# Patient Record
Sex: Male | Born: 1985 | Race: White | Hispanic: Yes | Marital: Married | Smoking: Never smoker
Health system: Southern US, Community
[De-identification: ages and names within clinical notes are randomized; demographics above are authoritative.]

## PROBLEM LIST (undated history)

## (undated) HISTORY — PX: SPINE SURGERY: SHX786

## (undated) HISTORY — PX: SPINAL FUSION W/ LUQUE UNIT ROD: SHX2426

---

## 2012-01-30 ENCOUNTER — Ambulatory Visit (INDEPENDENT_AMBULATORY_CARE_PROVIDER_SITE_OTHER): Payer: BC Managed Care – PPO | Admitting: Emergency Medicine

## 2012-01-30 VITALS — BP 96/63 | HR 91 | Temp 99.0°F | Resp 18 | Ht 66.5 in | Wt 176.0 lb

## 2012-01-30 DIAGNOSIS — R509 Fever, unspecified: Secondary | ICD-10-CM

## 2012-01-30 DIAGNOSIS — J029 Acute pharyngitis, unspecified: Secondary | ICD-10-CM

## 2012-01-30 DIAGNOSIS — A088 Other specified intestinal infections: Secondary | ICD-10-CM

## 2012-01-30 LAB — POCT RAPID STREP A (OFFICE): Rapid Strep A Screen: NEGATIVE

## 2012-01-30 MED ORDER — ONDANSETRON 4 MG PO TBDP: 4.0000 mg | ORAL_TABLET | Freq: Four times a day (QID) | ORAL | Status: AC | PRN

## 2012-01-30 MED ORDER — LOPERAMIDE HCL 2 MG PO TABS: 2.0000 mg | ORAL_TABLET | Freq: Four times a day (QID) | ORAL | Status: AC | PRN

## 2012-01-30 NOTE — Patient Instructions (Signed)
Viral Gastroenteritis Viral gastroenteritis is also known as stomach flu. This condition affects the stomach and intestinal tract. It can cause sudden diarrhea and vomiting. The illness typically lasts 3 to 8 days. Most people develop an immune response that eventually gets rid of the virus. While this natural response develops, the virus can make you quite ill. CAUSES  Many different viruses can cause gastroenteritis, such as rotavirus or noroviruses. You can catch one of these viruses by consuming contaminated food or water. You may also catch a virus by sharing utensils or other personal items with an infected person or by touching a contaminated surface. SYMPTOMS  The most common symptoms are diarrhea and vomiting. These problems can cause a severe loss of body fluids (dehydration) and a body salt (electrolyte) imbalance. Other symptoms may include:  Fever.   Headache.   Fatigue.   Abdominal pain.  DIAGNOSIS  Your caregiver can usually diagnose viral gastroenteritis based on your symptoms and a physical exam. A stool sample may also be taken to test for the presence of viruses or other infections. TREATMENT  This illness typically goes away on its own. Treatments are aimed at rehydration. The most serious cases of viral gastroenteritis involve vomiting so severely that you are not able to keep fluids down. In these cases, fluids must be given through an intravenous line (IV). HOME CARE INSTRUCTIONS   Drink enough fluids to keep your urine clear or pale yellow. Drink small amounts of fluids frequently and increase the amounts as tolerated.   Ask your caregiver for specific rehydration instructions.   Avoid:   Foods high in sugar.   Alcohol.   Carbonated drinks.   Tobacco.   Juice.   Caffeine drinks.   Extremely hot or cold fluids.   Fatty, greasy foods.   Too much intake of anything at one time.   Dairy products until 24 to 48 hours after diarrhea stops.   You may  consume probiotics. Probiotics are active cultures of beneficial bacteria. They may lessen the amount and number of diarrheal stools in adults. Probiotics can be found in yogurt with active cultures and in supplements.   Wash your hands well to avoid spreading the virus.   Only take over-the-counter or prescription medicines for pain, discomfort, or fever as directed by your caregiver. Do not give aspirin to children. Antidiarrheal medicines are not recommended.   Ask your caregiver if you should continue to take your regular prescribed and over-the-counter medicines.   Keep all follow-up appointments as directed by your caregiver.  SEEK IMMEDIATE MEDICAL CARE IF:   You are unable to keep fluids down.   You do not urinate at least once every 6 to 8 hours.   You develop shortness of breath.   You notice blood in your stool or vomit. This may look like coffee grounds.   You have abdominal pain that increases or is concentrated in one small area (localized).   You have persistent vomiting or diarrhea.   You have a fever.   The patient is a child younger than 3 months, and he or she has a fever.   The patient is a child older than 3 months, and he or she has a fever and persistent symptoms.   The patient is a child older than 3 months, and he or she has a fever and symptoms suddenly get worse.   The patient is a baby, and he or she has no tears when crying.  MAKE SURE YOU:     Understand these instructions.   Will watch your condition.   Will get help right away if you are not doing well or get worse.  Document Released: 06/07/2005 Document Revised: 05/27/2011 Document Reviewed: 03/24/2011 Exit[employer] Patient Information 2012 [employer]. 

## 2012-01-30 NOTE — Progress Notes (Signed)
   Date:  01/30/2012   Name:  Mario Pierce   DOB:  May 29, 1986   MRN:  [id number] Gender: male  Age: 26 y.o.  PCP:  No primary provider on file.    Chief Complaint: Knee Pain, Chills, Fever, Nausea, Headache and Generalized Body Aches   History of Present Illness:  Mario Pierce is a 26 y.o. pleasant patient who presents with the following:  Traveled to [place] for weekend and returned yesterday.  Has experienced myalgias, arthralgias, headache, nausea no vomiting.  On episode of loose stool yesterday.  No fever but chilled.  Some abdominal discomfort.  Poor po intake today.  No other sick at home or work.  There is no problem list on file for this patient.   No past medical history on file.  No past surgical history on file.  History  Substance Use Topics  . Smoking status: Never Smoker   . Smokeless tobacco: Not on file  . Alcohol Use: No    No family history on file.  No Known Allergies  Medication list has been reviewed and updated.  No current outpatient prescriptions on file prior to visit.    Review of Systems:  As per HPI, otherwise negative.    Physical Examination: Filed Vitals:   01/30/12 1709  BP: 96/63  Pulse: 91  Temp: 99 F (37.2 C)  Resp: 18   Filed Vitals:   01/30/12 1709  Height: 5' 6.5" (1.689 m)  Weight: 176 lb (79.833 kg)   Body mass index is 27.98 kg/(m^2). Ideal Body Weight: Weight in (lb) to have BMI = 25: 156.9    GEN: WDWN, NAD, Non-toxic, Alert & Oriented x 3 HEENT: Atraumatic, Normocephalic. Red throat.   Ears and Nose: No external deformity. EXTR: No clubbing/cyanosis/edema NEURO: Normal gait.  PSYCH: Normally interactive. Conversant. Not depressed or anxious appearing.  Calm demeanor.  Abdomen soft not tender benign Assessment and Plan: Viral gastroenteritis zofran Imodium Fluids Follow up as needed   Carmelina Dane, MD  Results for orders placed in visit on 01/30/12  POCT RAPID STREP A (OFFICE)      Component  Value Range   Rapid Strep A Screen Negative  Negative

## 2012-12-20 ENCOUNTER — Ambulatory Visit (INDEPENDENT_AMBULATORY_CARE_PROVIDER_SITE_OTHER): Payer: BC Managed Care – PPO | Admitting: Family Medicine

## 2012-12-20 ENCOUNTER — Other Ambulatory Visit: Payer: Self-pay

## 2012-12-20 VITALS — BP 110/70 | HR 70 | Temp 98.0°F | Resp 16 | Ht 66.0 in | Wt 187.0 lb

## 2012-12-20 DIAGNOSIS — R058 Other specified cough: Secondary | ICD-10-CM

## 2012-12-20 DIAGNOSIS — R05 Cough: Secondary | ICD-10-CM

## 2012-12-20 DIAGNOSIS — R059 Cough, unspecified: Secondary | ICD-10-CM

## 2012-12-20 MED ORDER — BENZONATATE 100 MG PO CAPS: ORAL_CAPSULE | ORAL | Status: DC

## 2012-12-20 MED ORDER — ALBUTEROL SULFATE (2.5 MG/3ML) 0.083% IN NEBU
2.5000 mg | INHALATION_SOLUTION | Freq: Once | RESPIRATORY_TRACT | Status: AC
  Administered 2012-12-20: 2.5 mg via RESPIRATORY_TRACT

## 2012-12-20 MED ORDER — HYDROCODONE-HOMATROPINE 5-1.5 MG/5ML PO SYRP: 5.0000 mL | ORAL_SOLUTION | ORAL | Status: DC | PRN

## 2012-12-20 MED ORDER — ALBUTEROL SULFATE HFA 108 (90 BASE) MCG/ACT IN AERS: 2.0000 | INHALATION_SPRAY | Freq: Four times a day (QID) | RESPIRATORY_TRACT | Status: AC | PRN

## 2012-12-20 MED ORDER — PREDNISONE 20 MG PO TABS: ORAL_TABLET | ORAL | Status: DC

## 2012-12-20 NOTE — Patient Instructions (Addendum)
Drink plenty of fluids  Get enough rest  Use the inhaler 2 inhalations 4 times daily  Take the prednisone 3 pills daily for 2 days, then 2 daily for 2 days, then one daily for 2 days  Use the cough syrup when you're not going to be on duty or before sleep  Use the cough pills every 6-8 hours as needed during waking hours  Return if worse

## 2012-12-20 NOTE — Progress Notes (Signed)
Subjective: Patient has had a cough and congestion for about 3 weeks. He went elsewhere to be seen by a physician 11 days ago, and was treated with a Z-Pak and some cough pills and Mucinex DM. He has not improved. He continues to persistently cough. He is not wheezing. He is not very productive of sputum. He is not feverish. He started coughing.  Objective: Constantly coughing otherwise in no acute distress. His TMs are normal. Throat is clear. His uvula is a little bit edematous dangling. Neck supple without significant nodes. Chest is clear to auscultation without wheezing rhonchi or rales. Heart regular without murmurs.he flow was 460 with a predicted 600.  Assessment: Post viral cough syndrome  Cough  Plan: Nebulizer  albuterol

## 2015-04-29 ENCOUNTER — Emergency Department (INDEPENDENT_AMBULATORY_CARE_PROVIDER_SITE_OTHER): Payer: BLUE CROSS/BLUE SHIELD

## 2015-04-29 ENCOUNTER — Encounter: Payer: Self-pay | Admitting: *Deleted

## 2015-04-29 ENCOUNTER — Emergency Department
Admission: EM | Admit: 2015-04-29 | Discharge: 2015-04-29 | Disposition: A | Discharge status: Home / Self Care | Payer: BLUE CROSS/BLUE SHIELD | Source: Home / Self Care | Attending: Family Medicine | Admitting: Family Medicine

## 2015-04-29 DIAGNOSIS — R05 Cough: Secondary | ICD-10-CM

## 2015-04-29 DIAGNOSIS — J209 Acute bronchitis, unspecified: Secondary | ICD-10-CM

## 2015-04-29 MED ORDER — AZITHROMYCIN 250 MG PO TABS: ORAL_TABLET | ORAL | Status: DC

## 2015-04-29 MED ORDER — GUAIFENESIN-CODEINE 100-10 MG/5ML PO SOLN: ORAL | Status: DC

## 2015-04-29 MED ORDER — PREDNISONE 20 MG PO TABS: 20.0000 mg | ORAL_TABLET | Freq: Two times a day (BID) | ORAL | Status: DC

## 2015-04-29 NOTE — ED Provider Notes (Signed)
CSN: [id number]     Arrival date & time 04/29/15  1113 History   First MD Initiated Contact with Patient 04/29/15 1144     Chief Complaint  Patient presents with  . Cough      HPI Comments: About 3 weeks ago patient developed typical cold-like symptoms including mild sore throat, sinus congestion, headache, fatigue, and cough.  The nasal congestion and sore throat have improved but he has a persistent cough that is partly productive and worse at night.  He occasionally wheezes, and has had chills/sweats at night.  He often coughs until he gags, especially at night.  He states that he had a Tdap just before his present illness started.  The history is provided by the patient.    History reviewed. No pertinent past medical history. Past Surgical History  Procedure Laterality Date  . Spine surgery     Family History  Problem Relation Age of Onset  . Adopted: Yes   Social History  Substance Use Topics  . Smoking status: Never Smoker   . Smokeless tobacco: None  . Alcohol Use: No    Review of Systems  + sore throat, resolved + cough No pleuritic pain + wheezing + nasal congestion + post-nasal drainage No sinus pain/pressure No itchy/red eyes No earache No hemoptysis + SOB with activity No fever, + chills/sweats No nausea No vomiting No abdominal pain No diarrhea No urinary symptoms No skin rash + fatigue No myalgias + headache Used OTC meds without relief  Allergies  Review of patient's allergies indicates no known allergies.  Home Medications   Prior to Admission medications   Medication Sig Start Date End Date Taking? Authorizing Provider  albuterol (PROVENTIL HFA;VENTOLIN HFA) 108 (90 BASE) MCG/ACT inhaler Inhale 2 puffs into the lungs every 6 (six) hours as needed for wheezing. 12/20/12   Peyton Najjar, MD  azithromycin (ZITHROMAX Z-PAK) 250 MG tablet Take 2 tabs today; then begin one tab once daily for 4 more days. 04/29/15   Lattie Haw, MD   guaiFENesin-codeine 100-10 MG/5ML syrup Take 10mL by mouth at bedtime as needed for cough 04/29/15   Lattie Haw, MD  predniSONE (DELTASONE) 20 MG tablet Take 1 tablet (20 mg total) by mouth 2 (two) times daily. Take with food. 04/29/15   Lattie Haw, MD   Meds Ordered and Administered this Visit  Medications - No data to display  BP 122/80 mmHg  Pulse 115  Temp(Src) 98.5 F (36.9 C) (Oral)  Resp 18  Ht  (1.727 m)  Wt 222 lb (100.699 kg)  BMI 33.76 kg/m2  SpO2 98% No data found.   Physical Exam Nursing notes and Vital Signs reviewed. Appearance:  Patient appears stated age, and in no acute distress Eyes:  Pupils are equal, round, and reactive to light and accomodation.  Extraocular movement is intact.  Conjunctivae are not inflamed  Ears:  Canals normal.  Tympanic membranes normal.  Nose:  Mildly congested turbinates.  No sinus tenderness.   Pharynx:  Normal Neck:  Supple.  Tender enlarged posterior nodes are palpated bilaterally  Lungs:  Clear to auscultation.  Breath sounds are equal.  Moving air well. Heart:  Regular rate and rhythm without murmurs, rubs, or gallops.  Abdomen:  Nontender without masses or hepatosplenomegaly.  Bowel sounds are present.  No CVA or flank tenderness.  Extremities:  No edema.    Skin:  No rash present.   ED Course  Procedures  None   Imaging  Review Dg Chest 2 View  04/29/2015  CLINICAL DATA:  Cough for 2.5 weeks EXAM: CHEST  2 VIEW COMPARISON:  None. FINDINGS: Lungs are clear. Heart size and pulmonary vascularity are normal. No adenopathy. There is evidence of old rib trauma with remodeling involving the right lateral seventh rib. There is postoperative change at multiple levels in the thoracic and visualized lumbar spine. 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 Postoperative change in the spinal region. No edema or consolidation. Electronically Signed   By: Bretta BangWilliam  Woodruff III M.D.   On: 04/29/2015 12:15      MDM   1. Acute  bronchitis, unspecified organism; ?pertussis    Begin Z-pak for atypical coverage.  Rx for Robitussin AC for night time cough.  Begin prednisone burst. Take plain guaifenesin (1200mg  extended release tabs such as Mucinex) twice daily, with plenty of water, for cough and congestion.  If desired, may add Pseudoephedrine (30mg , one or two every 4 to 6 hours) for sinus congestion.  Get adequate rest.   Stop all antihistamines for now, and other non-prescription cough/cold preparations.   Follow-up with family doctor if not improving about 7 to10 days.     Lattie HawStephen A Nakhia Levitan, MD 04/29/15 [phone]

## 2015-04-29 NOTE — Discharge Instructions (Signed)
Take plain guaifenesin (1200mg  extended release tabs such as Mucinex) twice daily, with plenty of water, for cough and congestion.  If desired, may add Pseudoephedrine (30mg , one or two every 4 to 6 hours) for sinus congestion.  Get adequate rest.   Stop all antihistamines for now, and other non-prescription cough/cold preparations.   Follow-up with family doctor if not improving about 7 to10 days.

## 2015-04-29 NOTE — ED Notes (Signed)
Pt c/o URI s/s and nasal congestion x 3 wks, with productive cough now. Denies fever.

## 2015-07-02 DIAGNOSIS — K760 Fatty (change of) liver, not elsewhere classified: Secondary | ICD-10-CM | POA: Insufficient documentation

## 2016-04-13 IMAGING — CR DG CHEST 2V
2 series · 2 of 2 positions shown · non-contrast
Comparison: None.

CLINICAL DATA: Cough for 2.5 weeks

EXAM:
CHEST  2 VIEW

[chest pa]
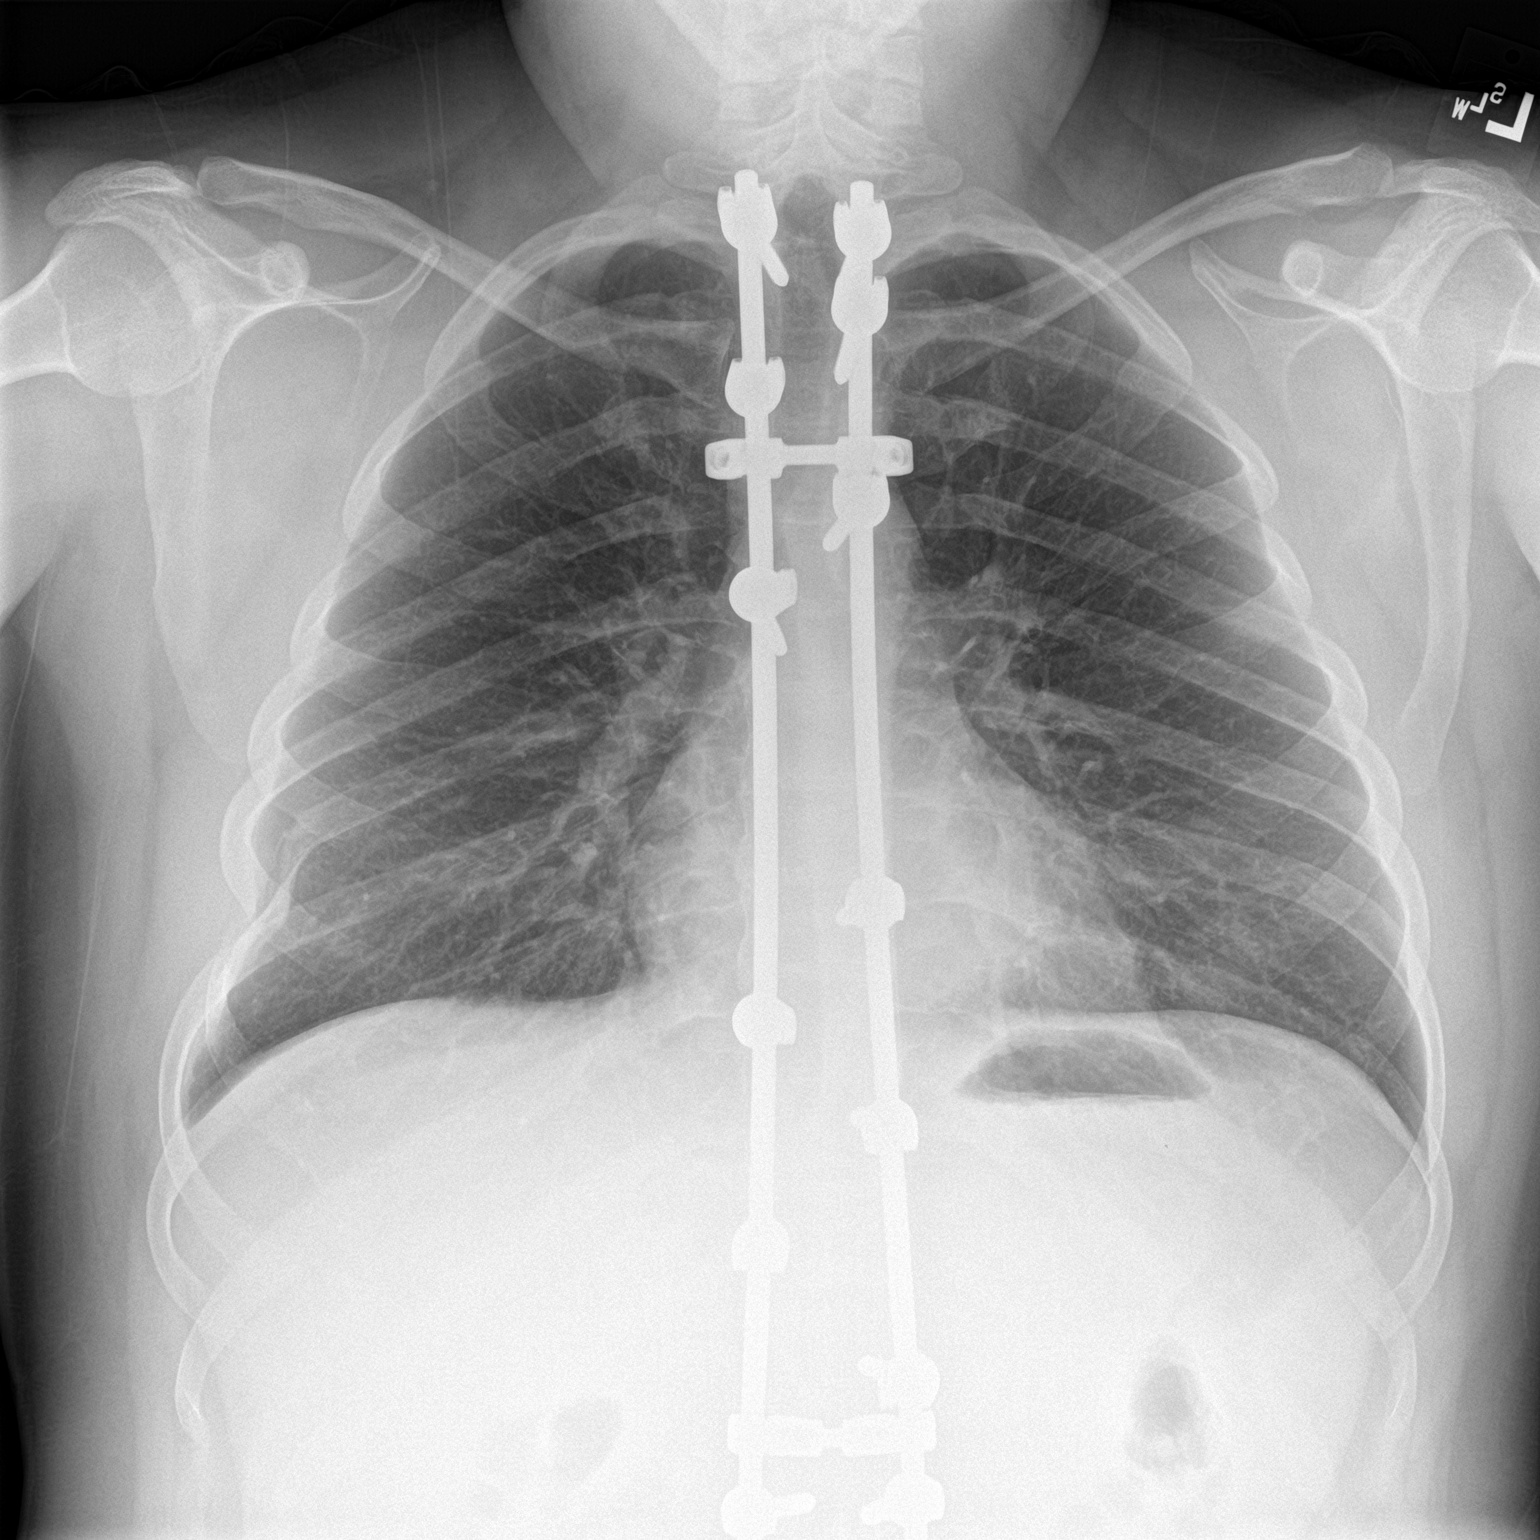

[chest lat]
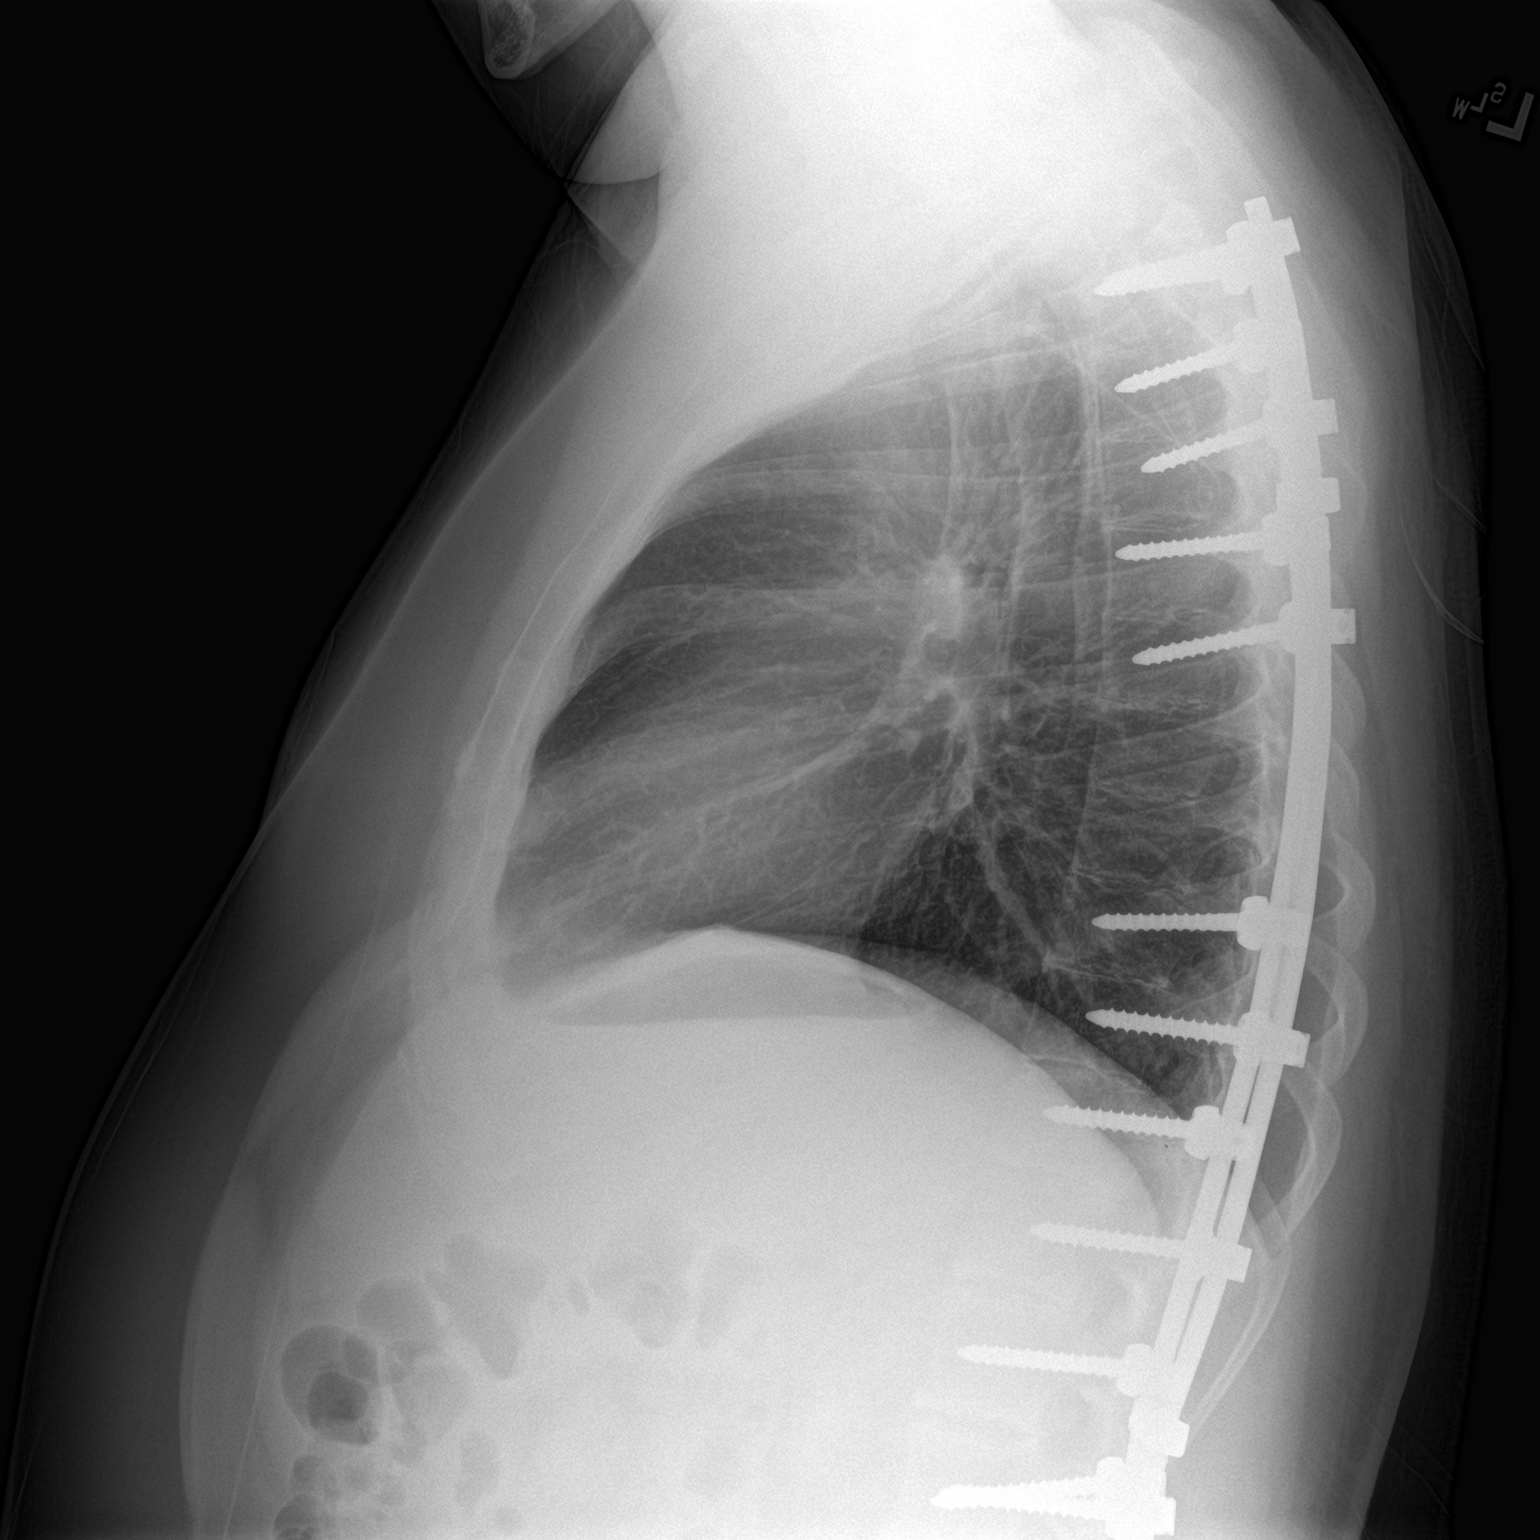

[2 of 2 positions shown; findings below may reference images not displayed]

FINDINGS: Lungs are clear. Heart size and pulmonary vascularity are normal. No
adenopathy. There is evidence of old rib trauma with remodeling
involving the right lateral seventh rib. There is postoperative
change at multiple levels in the thoracic and visualized lumbar
spine.
IMPRESSION: Old rib trauma on the right. Postoperative change in the spinal
region. No edema or consolidation.

## 2020-12-08 ENCOUNTER — Ambulatory Visit (INDEPENDENT_AMBULATORY_CARE_PROVIDER_SITE_OTHER): Payer: Managed Care, Other (non HMO) | Admitting: Family Medicine

## 2020-12-08 ENCOUNTER — Encounter: Payer: Self-pay | Admitting: Family Medicine

## 2020-12-08 VITALS — BP 123/70 | HR 72 | Temp 98.0°F | Ht 67.91 in | Wt 198.6 lb

## 2020-12-08 DIAGNOSIS — R110 Nausea: Secondary | ICD-10-CM | POA: Diagnosis not present

## 2020-12-08 DIAGNOSIS — R140 Abdominal distension (gaseous): Secondary | ICD-10-CM | POA: Diagnosis not present

## 2020-12-08 DIAGNOSIS — F411 Generalized anxiety disorder: Secondary | ICD-10-CM | POA: Diagnosis not present

## 2020-12-08 DIAGNOSIS — K5909 Other constipation: Secondary | ICD-10-CM

## 2020-12-08 MED ORDER — DULOXETINE HCL 30 MG PO CPEP: 30.0000 mg | ORAL_CAPSULE | Freq: Every day | ORAL | 3 refills | Status: DC

## 2020-12-08 NOTE — Patient Instructions (Signed)
Let's try adding cymbalta on.  Referral entered to GI.  See me again in about 1 month.

## 2020-12-08 NOTE — Progress Notes (Signed)
Mario Pierce - 35 y.o. male MRN [id number]  Date of birth: [birth year]  Subjective Chief Complaint  Patient presents with   Establish Care    HPI Mario Pierce is a 35 y.o. male here today for initial visit to establish care.  He has some GI issues for several months.  These include bloating, constipation, cramping and nausea.  He has been seen at [facility] and had endoscopy previously.  He had some labs including celiac panel.  He was started on linzess which has provided some relief of constipation however he continues to have bloating and cramping.    He also admits to increased stress with anxiety.  He had this previously and took cymbalta.  He did very well with this and did not have significant side effects that he recalls.   ROS:  A comprehensive ROS was completed and negative except as noted per HPI  Allergies  Allergen Reactions   Mushroom Extract Complex Rash    History reviewed. No pertinent past medical history.  Past Surgical History:  Procedure Laterality Date   SPINAL FUSION W/ LUQUE UNIT ROD     SPINE SURGERY      Social History   Socioeconomic History   Marital status: Married    Spouse name: Not on file   Number of children: Not on file   Years of education: Not on file   Highest education level: Not on file  Occupational History   Occupation: Emergency planning/management officer  Tobacco Use   Smoking status: Never   Smokeless tobacco: Never  Vaping Use   Vaping Use: Never used  Substance and Sexual Activity   Alcohol use: Yes    Alcohol/week: 1.0 - 2.0 standard drink    Types: 1 - 2 Standard drinks or equivalent per week    Comment: Rarely   Drug use: No   Sexual activity: Yes    Partners: Female  Other Topics Concern   Not on file  Social History Narrative   Not on file   Social Determinants of Health   Financial Resource Strain: Not on file  Food Insecurity: Not on file  Transportation Needs: Not on file  Physical Activity: Not on file  Stress: Not on file  Social  Connections: Not on file    Family History  Adopted: Yes    Health Maintenance  Topic Date Due   HIV Screening  Never done   Hepatitis C Screening  Never done   COVID-19 Vaccine (3 - Booster for Pfizer series) 02/18/2020   INFLUENZA VACCINE  01/19/2021   TETANUS/TDAP  03/26/2025   Pneumococcal Vaccine 58-2 Years old  Aged Out   HPV VACCINES  Aged Out     ----------------------------------------------------------------------------------------------------------------------------------------------------------------------------------------------------------------- Physical Exam BP 123/70 (BP Location: Left Arm, Patient Position: Sitting, Cuff Size: Normal)   Pulse 72   Temp 98 F (36.7 C)   Ht 5' 7.91" (1.725 m)   Wt 198 lb 9.6 oz (90.1 kg)   SpO2 97%   BMI 30.27 kg/m   Physical Exam Constitutional:      Appearance: Normal appearance.  Eyes:     General: No scleral icterus. Cardiovascular:     Rate and Rhythm: Normal rate and regular rhythm.  Pulmonary:     Effort: Pulmonary effort is normal.     Breath sounds: Normal breath sounds.  Neurological:     General: No focal deficit present.     Mental Status: He is alert.  Psychiatric:        Mood  and Affect: Mood normal.    ------------------------------------------------------------------------------------------------------------------------------------------------------------------------------------------------------------------- Assessment and Plan  Chronic constipation Chronic constipation with bloating and cramping.  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  Recommend continuation of linzess.  Requesting referral to [facility], orders entered.  Can try IBgard as well.   GAD (generalized anxiety disorder) Good relief with cymbalta previously.  Will restart at 30mg .  Potential side effects reviewed.   Return in about 4 weeks (around 01/05/2021) for Mood.   Meds ordered this encounter  Medications   DULoxetine (CYMBALTA) 30 MG capsule     Sig: Take 1 capsule (30 mg total) by mouth daily.    Dispense:  30 capsule    Refill:  3    Return in about 4 weeks (around 01/05/2021) for Mood.    This visit occurred during the SARS-CoV-2 public health emergency.  Safety protocols were in place, including screening questions prior to the visit, additional usage of staff PPE, and extensive cleaning of exam room while observing appropriate contact time as indicated for disinfecting solutions.

## 2020-12-08 NOTE — Assessment & Plan Note (Signed)
Good relief with cymbalta previously.  Will restart at 30mg .  Potential side effects reviewed.   Return in about 4 weeks (around 01/05/2021) for Mood.

## 2020-12-08 NOTE — Assessment & Plan Note (Signed)
Chronic constipation with bloating and cramping.  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  Recommend continuation of linzess.  Requesting referral to [facility], orders entered.  Can try IBgard as well.

## 2021-11-23 ENCOUNTER — Telehealth: Payer: Self-pay | Admitting: General Practice

## 2021-11-23 NOTE — Telephone Encounter (Signed)
Transition Care Management Unsuccessful Follow-up Telephone Call  Date of discharge and from where:  11/19/21 from [facility]  Attempts:  1st Attempt  Reason for unsuccessful TCM follow-up call:  Left voice message    

## 2021-11-24 NOTE — Telephone Encounter (Signed)
Transition Care Management Unsuccessful Follow-up Telephone Call  Date of discharge and from where:  11/19/21 from [facility]  Attempts:  2nd Attempt  Reason for unsuccessful TCM follow-up call:  Left voice message

## 2021-11-25 NOTE — Telephone Encounter (Signed)
Transition Care Management Unsuccessful Follow-up Telephone Call  Date of discharge and from where:  11/19/21 from [facility]  Attempts:  3rd Attempt  Reason for unsuccessful TCM follow-up call:  Left voice message    

## 2021-12-28 ENCOUNTER — Encounter: Payer: Self-pay | Admitting: Family Medicine

## 2021-12-28 ENCOUNTER — Telehealth (INDEPENDENT_AMBULATORY_CARE_PROVIDER_SITE_OTHER): Payer: Self-pay | Admitting: Family Medicine

## 2021-12-28 DIAGNOSIS — N521 Erectile dysfunction due to diseases classified elsewhere: Secondary | ICD-10-CM

## 2021-12-28 DIAGNOSIS — F411 Generalized anxiety disorder: Secondary | ICD-10-CM

## 2021-12-28 DIAGNOSIS — N529 Male erectile dysfunction, unspecified: Secondary | ICD-10-CM | POA: Insufficient documentation

## 2021-12-28 MED ORDER — TADALAFIL 20 MG PO TABS: 10.0000 mg | ORAL_TABLET | ORAL | 11 refills | Status: AC | PRN

## 2021-12-28 MED ORDER — DULOXETINE HCL 30 MG PO CPEP: 30.0000 mg | ORAL_CAPSULE | Freq: Every day | ORAL | 3 refills | Status: DC

## 2021-12-28 NOTE — Assessment & Plan Note (Signed)
Cymbalta has worked well for his anxiety previously.  Given that we know this has been helpful in the past I think we should try to stick with this.  Did discuss that this can sometimes worsen erectile issues.  He would still like to proceed.  We will plan to follow-up in approximately 6 weeks.

## 2021-12-28 NOTE — Progress Notes (Signed)
Mario Pierce - 36 y.o. male MRN [id number]  Date of birth: [birth year]   This visit type was conducted due to national recommendations for restrictions regarding the COVID-19 Pandemic (e.g. social distancing).  This format is felt to be most appropriate for this patient at this time.  All issues noted in this document were discussed and addressed.  No physical exam was performed (except for noted visual exam findings with Video Visits).  I discussed the limitations of evaluation and management by telemedicine and the availability of in person appointments. The patient expressed understanding and agreed to proceed.  I connected withNAME@ on 12/28/21 at 11:30 AM EDT by a video enabled telemedicine application and verified that I am speaking with the correct person using two identifiers.  Present at visit: Everrett Coombe, DO Alejandro Mulling   Patient Location: Home [address] [place] [address]   Provider location:   [facility]  Chief Complaint  Patient presents with   Anxiety    HPI  Mario Pierce is a 36 y.o. male who presents via audio/video conferencing for a telehealth visit today.  He is wanting to discuss anxiety today.  Currently going through divorce and having increased anxiety related events.  He has been on Cymbalta previously for anxiety and this works pretty well for him.  He is curious if I think that this is worked well for his current anxiety.    Additionally he is currently in new relationship.  He has experienced some erectile and performance issues.  He thinks this is related to his anxiety.  He would be interested in trying medications to help with ED as well.  ROS:  A comprehensive ROS was completed and negative except as noted per HPI  History reviewed. No pertinent past medical history.  Past Surgical History:  Procedure Laterality Date   SPINAL FUSION W/ LUQUE UNIT ROD     SPINE SURGERY      Family History  Adopted: Yes    Social History   Socioeconomic  History   Marital status: Married    Spouse name: Not on file   Number of children: Not on file   Years of education: Not on file   Highest education level: Not on file  Occupational History   Occupation: Emergency planning/management officer  Tobacco Use   Smoking status: Never   Smokeless tobacco: Never  Vaping Use   Vaping Use: Never used  Substance and Sexual Activity   Alcohol use: Yes    Alcohol/week: 1.0 - 2.0 standard drink of alcohol    Types: 1 - 2 Standard drinks or equivalent per week    Comment: Rarely   Drug use: No   Sexual activity: Yes    Partners: Female  Other Topics Concern   Not on file  Social History Narrative   Not on file   Social Determinants of Health   Financial Resource Strain: Not on file  Food Insecurity: Not on file  Transportation Needs: Not on file  Physical Activity: Not on file  Stress: Not on file  Social Connections: Not on file  Intimate Partner Violence: Not on file     Current Outpatient Medications:    albuterol (PROVENTIL HFA;VENTOLIN HFA) 108 (90 BASE) MCG/ACT inhaler, Inhale 2 puffs into the lungs every 6 (six) hours as needed for wheezing., Disp: 1 Inhaler, Rfl: 0   LINZESS 72 MCG capsule, Take 72 mcg by mouth daily., Disp: , Rfl:    tadalafil (CIALIS) 20 MG tablet, Take 0.5-1 tablets (  10-20 mg total) by mouth every other day as needed for erectile dysfunction., Disp: 10 tablet, Rfl: 11   DULoxetine (CYMBALTA) 30 MG capsule, Take 1 capsule (30 mg total) by mouth daily., Disp: 30 capsule, Rfl: 3  EXAM:  VITALS per patient if applicable: Ht 5' 7.91" (1.725 m)   Wt 204 lb (92.5 kg)   BMI 31.10 kg/m   GENERAL: alert, oriented, appears well and in no acute distress  HEENT: atraumatic, conjunttiva clear, no obvious abnormalities on inspection of external nose and ears  NECK: normal movements of the head and neck  LUNGS: on inspection no signs of respiratory distress, breathing rate appears normal, no obvious gross SOB, gasping or  wheezing  CV: no obvious cyanosis  MS: moves all visible extremities without noticeable abnormality  PSYCH/NEURO: pleasant and cooperative, no obvious depression or anxiety, speech and thought processing grossly intact  ASSESSMENT AND PLAN:  Discussed the following assessment and plan:  GAD (generalized anxiety disorder) Cymbalta has worked well for his anxiety previously.  Given that we know this has been helpful in the past I think we should try to stick with this.  Did discuss that this can sometimes worsen erectile issues.  He would still like to proceed.  We will plan to follow-up in approximately 6 weeks.  Erectile dysfunction Adding trial of tadalafil.  Common side effects as well as red flags discussed with him.     I discussed the assessment and treatment plan with the patient. The patient was provided an opportunity to ask questions and all were answered. The patient agreed with the plan and demonstrated an understanding of the instructions.   The patient was advised to call back or seek an in-person evaluation if the symptoms worsen or if the condition fails to improve as anticipated.    Everrett Coombe, DO

## 2021-12-28 NOTE — Progress Notes (Signed)
GAD Skin tag on leg Erectile Dysfunction

## 2021-12-28 NOTE — Assessment & Plan Note (Signed)
Adding trial of tadalafil.  Common side effects as well as red flags discussed with him.

## 2022-04-23 ENCOUNTER — Other Ambulatory Visit: Payer: Self-pay

## 2022-04-25 MED ORDER — DULOXETINE HCL 30 MG PO CPEP: 30.0000 mg | ORAL_CAPSULE | Freq: Every day | ORAL | 3 refills | Status: DC

## 2022-04-26 ENCOUNTER — Other Ambulatory Visit: Payer: Self-pay | Admitting: Family Medicine

## 2022-07-07 DIAGNOSIS — N138 Other obstructive and reflux uropathy: Secondary | ICD-10-CM | POA: Insufficient documentation

## 2022-07-07 DIAGNOSIS — N5201 Erectile dysfunction due to arterial insufficiency: Secondary | ICD-10-CM | POA: Insufficient documentation

## 2022-07-07 DIAGNOSIS — N401 Enlarged prostate with lower urinary tract symptoms: Secondary | ICD-10-CM | POA: Insufficient documentation

## 2023-02-14 ENCOUNTER — Ambulatory Visit: Payer: Managed Care, Other (non HMO)

## 2023-02-14 ENCOUNTER — Ambulatory Visit (INDEPENDENT_AMBULATORY_CARE_PROVIDER_SITE_OTHER): Payer: Managed Care, Other (non HMO) | Admitting: Family Medicine

## 2023-02-14 ENCOUNTER — Encounter: Payer: Self-pay | Admitting: Family Medicine

## 2023-02-14 VITALS — BP 114/69 | HR 76 | Ht 67.91 in | Wt 208.0 lb

## 2023-02-14 DIAGNOSIS — L918 Other hypertrophic disorders of the skin: Secondary | ICD-10-CM

## 2023-02-14 DIAGNOSIS — F411 Generalized anxiety disorder: Secondary | ICD-10-CM

## 2023-02-14 DIAGNOSIS — M25532 Pain in left wrist: Secondary | ICD-10-CM | POA: Insufficient documentation

## 2023-02-14 MED ORDER — ESCITALOPRAM OXALATE 10 MG PO TABS: 10.0000 mg | ORAL_TABLET | Freq: Every day | ORAL | 0 refills | Status: DC
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
## 2023-02-14 NOTE — Assessment & Plan Note (Signed)
Xrays of wrist ordered.

## 2023-02-14 NOTE — Addendum Note (Signed)
Addended by: Ardyth Man on: 02/14/2023 10:14 AM   Modules accepted: Orders

## 2023-02-14 NOTE — Assessment & Plan Note (Signed)
Adding lexapro to replace cymbalta.  Side effect profile of medication discussed.

## 2023-02-14 NOTE — Assessment & Plan Note (Signed)
Fairly large skin tag noted on inside of L thigh.  Referral placed to dermatology.

## 2023-04-01 ENCOUNTER — Telehealth: Payer: Managed Care, Other (non HMO) | Admitting: Family Medicine

## 2023-04-11 ENCOUNTER — Encounter: Payer: Self-pay | Admitting: Family Medicine

## 2023-04-11 ENCOUNTER — Telehealth (INDEPENDENT_AMBULATORY_CARE_PROVIDER_SITE_OTHER): Payer: Managed Care, Other (non HMO) | Admitting: Family Medicine

## 2023-04-11 DIAGNOSIS — F411 Generalized anxiety disorder: Secondary | ICD-10-CM

## 2023-04-11 MED ORDER — ESCITALOPRAM OXALATE 10 MG PO TABS: 10.0000 mg | ORAL_TABLET | Freq: Every day | ORAL | 1 refills | Status: DC

## 2023-04-11 NOTE — Progress Notes (Signed)
Mario Pierce - 37 y.o. male MRN [id number]  Date of birth: Nov 06, 1985   This visit type was conducted due to national recommendations for restrictions regarding the COVID-19 Pandemic (e.g. social distancing).  This format is felt to be most appropriate for this patient at this time.  All issues noted in this document were discussed and addressed.  No physical exam was performed (except for noted visual exam findings with Video Visits).  I discussed the limitations of evaluation and management by telemedicine and the availability of in person appointments. The patient expressed understanding and agreed to proceed.  I connected withNAME@ on 04/11/23 at  8:50 AM EDT by a video enabled telemedicine application and verified that I am speaking with the correct person using two identifiers.  Present at visit: Everrett Coombe, DO Alejandro Mulling   Patient Location: Home [address] [place] [address]   Provider location:   [facility]  No chief complaint on file.   HPI  Mario Pierce is a 37 y.o. male who presents via audio/video conferencing for a telehealth visit today.  He is following up for anxiety.  Changed from cymbalta to lexapro due to side effects.  Reports that lexapro is working well for him at current strength. No significant side effects at this time.  Panic feeling has improved, doesn't have the tightness in his chest that he was having before.     ROS:  A comprehensive ROS was completed and negative except as noted per HPI  History reviewed. No pertinent past medical history.  Past Surgical History:  Procedure Laterality Date   SPINAL FUSION W/ LUQUE UNIT ROD     SPINE SURGERY      Family History  Adopted: Yes    Social History   Socioeconomic History   Marital status: Married    Spouse name: Not on file   Number of children: Not on file   Years of education: Not on file   Highest education level: Not on file  Occupational History   Occupation: Emergency planning/management officer  Tobacco  Use   Smoking status: Never   Smokeless tobacco: Never  Vaping Use   Vaping status: Never Used  Substance and Sexual Activity   Alcohol use: Yes    Alcohol/week: 1.0 - 2.0 standard drink of alcohol    Types: 1 - 2 Standard drinks or equivalent per week    Comment: Rarely   Drug use: No   Sexual activity: Yes    Partners: Female  Other Topics Concern   Not on file  Social History Narrative   Not on file   Social Determinants of Health   Financial Resource Strain: Not on file  Food Insecurity: Not on file  Transportation Needs: Not on file  Physical Activity: Not on file  Stress: Not on file  Social Connections: Unknown (11/01/2021)   Received from [facility]   Social Network    Social Network: Not on file  Intimate Partner Violence: Unknown (09/24/2021)   Received from [facility], [facility]   HITS    Physically Hurt: Not on file    Insult or Talk Down To: Not on file    Threaten Physical Harm: Not on file    Scream or Curse: Not on file     Current Outpatient Medications:    albuterol (PROVENTIL HFA;VENTOLIN HFA) 108 (90 BASE) MCG/ACT inhaler, Inhale 2 puffs into the lungs every 6 (six) hours as needed for wheezing., Disp: 1 Inhaler, Rfl: 0  escitalopram (LEXAPRO) 10 MG tablet, Take 1 tablet (10 mg total) by mouth daily. Take 1/2 tab po daily x7 days then increase to full tab, Disp: 90 tablet, Rfl: 1   tadalafil (CIALIS) 20 MG tablet, Take 0.5-1 tablets (10-20 mg total) by mouth every other day as needed for erectile dysfunction., Disp: 10 tablet, Rfl: 11  EXAM:  VITALS per patient if applicable: There were no vitals taken for this visit.  GENERAL: alert, oriented, appears well and in no acute distress  HEENT: atraumatic, conjunttiva clear, no obvious abnormalities on inspection of external nose and ears  NECK: normal movements of the head and neck  LUNGS: on inspection no signs of respiratory distress, breathing rate appears normal, no  obvious gross SOB, gasping or wheezing  CV: no obvious cyanosis  MS: moves all visible extremities without noticeable abnormality  PSYCH/NEURO: pleasant and cooperative, no obvious depression or anxiety, speech and thought processing grossly intact  ASSESSMENT AND PLAN:  Discussed the following assessment and plan:  GAD (generalized anxiety disorder) He is doing quite well with lexapro.  Will continue this at current strength.  F/ui n 6 months.      I discussed the assessment and treatment plan with the patient. The patient was provided an opportunity to ask questions and all were answered. The patient agreed with the plan and demonstrated an understanding of the instructions.   The patient was advised to call back or seek an in-person evaluation if the symptoms worsen or if the condition fails to improve as anticipated.    Everrett Coombe, DO

## 2023-04-11 NOTE — Assessment & Plan Note (Signed)
He is doing quite well with lexapro.  Will continue this at current strength.  F/ui n 6 months.

## 2023-09-19 ENCOUNTER — Other Ambulatory Visit (HOSPITAL_COMMUNITY): Payer: Self-pay

## 2023-09-19 ENCOUNTER — Telehealth (INDEPENDENT_AMBULATORY_CARE_PROVIDER_SITE_OTHER): Admitting: Family Medicine

## 2023-09-19 ENCOUNTER — Encounter: Payer: Self-pay | Admitting: Family Medicine

## 2023-09-19 VITALS — Ht 67.97 in | Wt 215.0 lb

## 2023-09-19 DIAGNOSIS — F411 Generalized anxiety disorder: Secondary | ICD-10-CM

## 2023-09-19 DIAGNOSIS — E669 Obesity, unspecified: Secondary | ICD-10-CM | POA: Insufficient documentation

## 2023-09-19 MED ORDER — ZEPBOUND 7.5 MG/0.5ML ~~LOC~~ SOAJ: 7.5000 mg | SUBCUTANEOUS | 0 refills | Status: DC

## 2023-09-19 MED ORDER — ZEPBOUND 5 MG/0.5ML ~~LOC~~ SOAJ: 5.0000 mg | SUBCUTANEOUS | 0 refills | Status: DC

## 2023-09-19 MED ORDER — ZEPBOUND 2.5 MG/0.5ML ~~LOC~~ SOAJ: 2.5000 mg | SUBCUTANEOUS | 0 refills | Status: DC

## 2023-09-19 MED ORDER — ESCITALOPRAM OXALATE 10 MG PO TABS: 10.0000 mg | ORAL_TABLET | Freq: Every day | ORAL | 1 refills | Status: DC

## 2023-09-19 NOTE — Progress Notes (Signed)
 Requesting medication to help lose weight.

## 2023-09-19 NOTE — Progress Notes (Signed)
 Mario Pierce - 38 y.o. male MRN [id number]  Date of birth: 05/06/1986   This visit type was conducted due to national recommendations for restrictions regarding the COVID-19 Pandemic (e.g. social distancing).  This format is felt to be most appropriate for this patient at this time.  All issues noted in this document were discussed and addressed.  No physical exam was performed (except for noted visual exam findings with Video Visits).  I discussed the limitations of evaluation and management by telemedicine and the availability of in person appointments. The patient expressed understanding and agreed to proceed.  I connected withNAME@ on 09/19/23 at  8:30 AM EDT by a video enabled telemedicine application and verified that I am speaking with the correct person using two identifiers.  Present at visit: Mario Coombe, DO Alejandro Mulling   Patient Location: Home [address] [place] [address]   Provider location:   [facility]  Chief Complaint  Patient presents with   Medical Management of Chronic Issues   Weight Loss    HPI  Mario Pierce is a 38 y.o. male who presents via audio/video conferencing for a telehealth visit today.  He would like to discuss difficulty with weight loss and medication to help with this.  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  Despite this, his weight has continued to increase.  He feels that his diet could be improved some.  He is having difficulty with his vest fitting appropriately so he is asking for assistance with weight loss.  He has never taken anything for weight loss in the past.    He remains on lexapro which he feels is working well for him.  He needs refill of this.    ROS:  A comprehensive ROS was completed and negative except as noted per HPI  No past medical history on file.  Past Surgical History:  Procedure Laterality Date   SPINAL FUSION W/ LUQUE UNIT ROD     SPINE SURGERY      Family History  Adopted: Yes    Social History    Socioeconomic History   Marital status: Married    Spouse name: Not on file   Number of children: Not on file   Years of education: Not on file   Highest education level: Not on file  Occupational History   Occupation: Emergency planning/management officer  Tobacco Use   Smoking status: Never   Smokeless tobacco: Never  Vaping Use   Vaping status: Never Used  Substance and Sexual Activity   Alcohol use: Yes    Alcohol/week: 1.0 - 2.0 standard drink of alcohol    Types: 1 - 2 Standard drinks or equivalent per week    Comment: Rarely   Drug use: No   Sexual activity: Yes    Partners: Female  Other Topics Concern   Not on file  Social History Narrative   Not on file   Social Drivers of Health   Financial Resource Strain: Not on file  Food Insecurity: Not on file  Transportation Needs: Not on file  Physical Activity: Not on file  Stress: Not on file  Social Connections: Unknown (11/01/2021)   Received from [facility]   Social Network    Social Network: Not on file  Intimate Partner Violence: Unknown (09/24/2021)   Received from [facility], [facility]   HITS    Physically Hurt: Not on file    Insult or Talk Down To: Not on file  Threaten Physical Harm: Not on file    Scream or Curse: Not on file     Current Outpatient Medications:    escitalopram (LEXAPRO) 10 MG tablet, Take 1 tablet (10 mg total) by mouth daily. Take 1/2 tab po daily x7 days then increase to full tab, Disp: 90 tablet, Rfl: 1   tirzepatide (ZEPBOUND) 2.5 MG/0.5ML Pen, Inject 2.5 mg into the skin once a week., Disp: 2 mL, Rfl: 0   tirzepatide (ZEPBOUND) 5 MG/0.5ML Pen, Inject 5 mg into the skin once a week., Disp: 2 mL, Rfl: 0   tirzepatide (ZEPBOUND) 7.5 MG/0.5ML Pen, Inject 7.5 mg into the skin once a week., Disp: 2 mL, Rfl: 0   albuterol (PROVENTIL HFA;VENTOLIN HFA) 108 (90 BASE) MCG/ACT inhaler, Inhale 2 puffs into the lungs every 6 (six) hours as needed for wheezing. (Patient not taking:  Reported on 09/19/2023), Disp: 1 Inhaler, Rfl: 0   tadalafil (CIALIS) 20 MG tablet, Take 0.5-1 tablets (10-20 mg total) by mouth every other day as needed for erectile dysfunction. (Patient not taking: Reported on 09/19/2023), Disp: 10 tablet, Rfl: 11  EXAM:  VITALS per patient if applicable: Ht 5' 7.97" (1.726 m)   Wt 215 lb (97.5 kg)   BMI 32.72 kg/m   GENERAL: alert, oriented, appears well and in no acute distress  HEENT: atraumatic, conjunttiva clear, no obvious abnormalities on inspection of external nose and ears  NECK: normal movements of the head and neck  LUNGS: on inspection no signs of respiratory distress, breathing rate appears normal, no obvious gross SOB, gasping or wheezing  CV: no obvious cyanosis  MS: moves all visible extremities without noticeable abnormality  PSYCH/NEURO: pleasant and cooperative, no obvious depression or anxiety, speech and thought processing grossly intact  ASSESSMENT AND PLAN:  Discussed the following assessment and plan:  Obesity (BMI 30-39.9) Patient is following physician supervised weight loss plan.  He has tried several things to help with management of his weight including dietary changes and increased exercise but has not been successful.  I have recommended addition of Zepbound to assist with his weight loss in addition to a dietary change and increased exercise.  He does not have any contraindications to Zepbound.     I discussed the assessment and treatment plan with the patient. The patient was provided an opportunity to ask questions and all were answered. The patient agreed with the plan and demonstrated an understanding of the instructions.   The patient was advised to call back or seek an in-person evaluation if the symptoms worsen or if the condition fails to improve as anticipated.    Mario Coombe, DO

## 2023-09-19 NOTE — Assessment & Plan Note (Addendum)
 Patient is following physician supervised weight loss plan for at least the past 3 months.  He has tried several things to help with management of his weight including dietary changes and increased exercise but has not been successful.   I have recommended addition of Zepbound to assist with his weight loss in addition to a dietary change and increased exercise.  He does not have any contraindications to Zepbound.

## 2023-09-19 NOTE — Assessment & Plan Note (Signed)
 Lexapro is working quite well for him.  Will plan to continue at current strength.

## 2023-09-20 ENCOUNTER — Telehealth: Payer: Self-pay

## 2023-09-20 ENCOUNTER — Other Ambulatory Visit (HOSPITAL_COMMUNITY): Payer: Self-pay

## 2023-09-20 ENCOUNTER — Ambulatory Visit: Payer: Managed Care, Other (non HMO) | Admitting: Dermatology

## 2023-09-20 NOTE — Telephone Encounter (Signed)
 Pharmacy Patient Advocate Encounter   Received notification from Physician's Office that prior authorization for Zepbound 5 is required/requested.   Insurance verification completed.   The patient is insured through [employer] .   Per test claim: PA required; PA submitted to above mentioned insurance via CoverMyMeds Key/confirmation #/EOC [id number] Status is pending

## 2023-09-26 NOTE — Telephone Encounter (Signed)
 Pharmacy Patient Advocate Encounter  Received notification from [employer] that Prior Authorization for Zepbound 5 has been DENIED.  Full denial letter will be uploaded to the media tab. See denial reason below.   PA #/Case ID/Reference #: [id number]

## 2023-09-28 NOTE — Telephone Encounter (Signed)
 Pharmacy Patient Advocate Encounter   Received notification from Physician's Office that prior authorization for Zepbound is required/requested.   Insurance verification completed.   The patient is insured through [employer] .   Per test claim: PA required; PA submitted to above mentioned insurance via CoverMyMeds Key/confirmation #/EOC [facility] Status is pending

## 2023-09-30 ENCOUNTER — Other Ambulatory Visit (HOSPITAL_COMMUNITY): Payer: Self-pay

## 2023-10-03 ENCOUNTER — Other Ambulatory Visit (HOSPITAL_COMMUNITY): Payer: Self-pay

## 2023-10-03 NOTE — Telephone Encounter (Signed)
 Pharmacy Patient Advocate Encounter  Received notification from [employer] that Prior Authorization for Zepbound 7.5 has been APPROVED from 09/28/23 to 05/30/24. Ran test claim, Copay is $0.00. This test claim was processed through [facility]- copay amounts may vary at other pharmacies due to pharmacy/plan contracts, or as the patient moves through the different stages of their insurance plan.   PA #/Case ID/Reference #: [id number]

## 2023-11-06 ENCOUNTER — Other Ambulatory Visit: Payer: Self-pay | Admitting: Family Medicine

## 2023-11-08 ENCOUNTER — Other Ambulatory Visit: Payer: Self-pay | Admitting: Family Medicine

## 2023-11-08 NOTE — Telephone Encounter (Signed)
 Copied from CRM [id number]. Topic: Clinical - Medication Refill >> Nov 08, 2023  1:32 PM Tisa Forester wrote: Medication:  Disp Refills Start End  tirzepatide  (ZEPBOUND ) 5 MG/0.5ML Pen       Has the patient contacted their pharmacy? Yes (Agent: If no, request that the patient contact the pharmacy for the refill. If patient does not wish to contact the pharmacy document the reason why and proceed with request.) (Agent: If yes, when and what did the pharmacy advise?)  This is the patient's preferred pharmacy:  [facility] [address] [place], [place] - [address] [place] [address] Phone: [phone] Fax: [phone]  Is this the correct pharmacy for this prescription? Yes If no, delete pharmacy and type the correct one.   Has the prescription been filled recently? No  Is the patient out of the medication? No  Has the patient been seen for an appointment in the last year OR does the patient have an upcoming appointment? Yes  Can we respond through MyChart? Yes  Agent: Please be advised that Rx refills may take up to 3 business days. We ask that you follow-up with your pharmacy.

## 2023-11-08 NOTE — Telephone Encounter (Signed)
 Patient advised that there is already a prescription for th 7.5 mg with the pharmacy. He will contact them.

## 2023-11-09 ENCOUNTER — Ambulatory Visit: Admitting: Dermatology

## 2023-12-12 ENCOUNTER — Other Ambulatory Visit: Payer: Self-pay | Admitting: Family Medicine

## 2023-12-12 NOTE — Telephone Encounter (Unsigned)
 Copied from CRM [id number]. Topic: Clinical - Medication Refill >> Dec 12, 2023  9:40 AM Carmell R wrote: Medication: tirzepatide  (ZEPBOUND ) 7.5 MG/0.5ML Pen  Has the patient contacted their pharmacy? Yes. No refills left  This is the patient's preferred pharmacy:  [facility] [address] [place], [place] - [address] [place] [address] Phone: [phone] Fax: [phone]  Is this the correct pharmacy for this prescription? Yes  Has the prescription been filled recently? Yes  Is the patient out of the medication? Yes. Pt due for injection this past Friday so needs it asap.  Has the patient been seen for an appointment in the last year OR does the patient have an upcoming appointment? Yes  Can we respond through MyChart? Yes  Agent: Please be advised that Rx refills may take up to 3 business days. We ask that you follow-up with your pharmacy.

## 2023-12-15 MED ORDER — ZEPBOUND 10 MG/0.5ML ~~LOC~~ SOAJ: 10.0000 mg | SUBCUTANEOUS | 0 refills | Status: DC

## 2023-12-15 NOTE — Telephone Encounter (Signed)
 Requesting rx refill of zepbound   Please see attached message from patient . AB    12/14/23  7:28 PM My last dose of the 7.5 was Friday June 13th Mario Pierce to Jon VEAR Pear, CMA AB    12/14/23  7:27 PM To the 10.  Yes, but i am behind a week.  Should I do another round of 7.5 before going to 10?  Last written as 7.5mg   on 09/19/23 Last OV 04/11/2023 Upcoming appt = none

## 2024-01-06 ENCOUNTER — Other Ambulatory Visit: Payer: Self-pay | Admitting: Family Medicine

## 2024-01-06 DIAGNOSIS — F411 Generalized anxiety disorder: Secondary | ICD-10-CM

## 2024-01-06 NOTE — Telephone Encounter (Signed)
 Copied from CRM [id number]. Topic: Clinical - Medication Refill >> Jan 06, 2024 11:13 AM Kevelyn M wrote: Medication: tirzepatide  (ZEPBOUND ) 10 MG/0.5ML Pen, escitalopram  (LEXAPRO ) 10 MG tablet  Has the patient contacted their pharmacy? Yes (Agent: If no, request that the patient contact the pharmacy for the refill. If patient does not wish to contact the pharmacy document the reason why and proceed with request.) (Agent: If yes, when and what did the pharmacy advise?)  This is the patient's preferred pharmacy:  [facility] [address] [place], [place] [phone]  Patient will be out of town  Is this the correct pharmacy for this prescription? Yes If no, delete pharmacy and type the correct one.   Has the prescription been filled recently? No  Is the patient out of the medication? No  Has the patient been seen for an appointment in the last year OR does the patient have an upcoming appointment? Yes  Can we respond through MyChart? Yes  Agent: Please be advised that Rx refills may take up to 3 business days. We ask that you follow-up with your pharmacy.

## 2024-01-10 MED ORDER — ZEPBOUND 10 MG/0.5ML ~~LOC~~ SOAJ: 10.0000 mg | SUBCUTANEOUS | 0 refills | Status: DC

## 2024-01-24 ENCOUNTER — Encounter: Payer: Self-pay | Admitting: Physician Assistant

## 2024-01-24 ENCOUNTER — Ambulatory Visit: Admitting: Physician Assistant

## 2024-01-24 DIAGNOSIS — D492 Neoplasm of unspecified behavior of bone, soft tissue, and skin: Secondary | ICD-10-CM

## 2024-01-24 DIAGNOSIS — D489 Neoplasm of uncertain behavior, unspecified: Secondary | ICD-10-CM

## 2024-01-24 DIAGNOSIS — D171 Benign lipomatous neoplasm of skin and subcutaneous tissue of trunk: Secondary | ICD-10-CM | POA: Diagnosis not present

## 2024-01-24 NOTE — Progress Notes (Signed)
   New Patient Visit   Subjective  Mario Pierce is a 38 y.o. male who presents for the following: Growth.   Lesion present and growing for over 1 year now. Gets irritated from just walking.   Accompanied by his girlfriend today.   The following portions of the chart were reviewed this encounter and updated as appropriate: medications, allergies, medical history  Review of Systems:  No other skin or systemic complaints except as noted in HPI or Assessment and Plan.  Objective  Well appearing patient in no apparent distress; mood and affect are within normal limits.  A focused examination was performed of the following areas: Inguinal region to include scrotum.   Relevant physical exam findings are noted in the Assessment and Plan.  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    NEOPLASM OF UNCERTAIN BEHAVIOR Pubic Epidermal / dermal shaving  Lesion diameter (cm):  4 Informed consent: discussed and consent obtained   Patient was prepped and draped in usual sterile fashion: area prepped with alcohol. Anesthesia: the lesion was anesthetized in a standard fashion   Anesthetic:  1% lidocaine w/ epinephrine 1-100,000 buffered w/ 8.4% NaHCO3 Instrument used: scissors   Instrument used comment:  Snip excision Hemostasis achieved with: pressure, aluminum chloride and electrodesiccation   Outcome: patient tolerated procedure well   Post-procedure details: wound care instructions given   Post-procedure details comment:  Ointment and bandaid applied  Specimen 1 - Surgical pathology Differential Diagnosis: Lipomatosus Superficial vs other  Check Margins: No   Return if symptoms worsen or fail to improve.  I, Gordan Beams, CMA, am acting as scribe for Masaichi Kracht K, PA-C.   Documentation: I have reviewed the above  documentation for accuracy and completeness, and I agree with the above.  Vadim Centola K, PA-C

## 2024-01-27 LAB — SURGICAL PATHOLOGY

## 2024-01-30 ENCOUNTER — Ambulatory Visit: Payer: Self-pay | Admitting: Physician Assistant

## 2024-02-08 ENCOUNTER — Ambulatory Visit: Admitting: Family Medicine

## 2024-02-08 ENCOUNTER — Encounter: Payer: Self-pay | Admitting: Family Medicine

## 2024-02-08 VITALS — BP 93/65 | HR 78 | Ht 67.97 in | Wt 200.0 lb

## 2024-02-08 DIAGNOSIS — Z13220 Encounter for screening for lipoid disorders: Secondary | ICD-10-CM

## 2024-02-08 DIAGNOSIS — E669 Obesity, unspecified: Secondary | ICD-10-CM

## 2024-02-08 DIAGNOSIS — K760 Fatty (change of) liver, not elsewhere classified: Secondary | ICD-10-CM | POA: Diagnosis not present

## 2024-02-08 DIAGNOSIS — R5383 Other fatigue: Secondary | ICD-10-CM | POA: Insufficient documentation

## 2024-02-08 DIAGNOSIS — K5909 Other constipation: Secondary | ICD-10-CM

## 2024-02-08 MED ORDER — ZEPBOUND 10 MG/0.5ML ~~LOC~~ SOAJ: 10.0000 mg | SUBCUTANEOUS | 2 refills | Status: DC

## 2024-02-08 NOTE — Progress Notes (Signed)
 Mario Pierce - 38 y.o. male MRN [id number]  Date of birth: 10-11-1985  Subjective Chief Complaint  Patient presents with   Weight Check    HPI Mario Pierce is a 38 y.o. male here today for follow up.   He has been using Zepbound  for weight management.  He reports that he is doing pretty well with this and is happy with weight loss so far.  He has had some constipation and is trying to consume more fiber.  He has also noticed some diminished energy levels.  He denies significant nausea from the medication.  He is working on increasing his exercise and is meeting with a nutritionist through his employer.    ROS:  A comprehensive ROS was completed and negative except as noted per HPI  Allergies  Allergen Reactions   Mushroom Extract Complex (Obsolete) Rash    No past medical history on file.  Past Surgical History:  Procedure Laterality Date   SPINAL FUSION W/ LUQUE UNIT ROD     SPINE SURGERY      Social History   Socioeconomic History   Marital status: Married    Spouse name: Not on file   Number of children: Not on file   Years of education: Not on file   Highest education level: Not on file  Occupational History   Occupation: Emergency planning/management officer  Tobacco Use   Smoking status: Never   Smokeless tobacco: Never  Vaping Use   Vaping status: Never Used  Substance and Sexual Activity   Alcohol use: Yes    Alcohol/week: 1.0 - 2.0 standard drink of alcohol    Types: 1 - 2 Standard drinks or equivalent per week    Comment: Rarely   Drug use: No   Sexual activity: Yes    Partners: Female  Other Topics Concern   Not on file  Social History Narrative   Not on file   Social Drivers of Health   Financial Resource Strain: Not on file  Food Insecurity: No Food Insecurity (02/08/2024)   Hunger Vital Sign    Worried About Running Out of Food in the Last Year: Never true    Ran Out of Food in the Last Year: Never true  Transportation Needs: No Transportation Needs (02/08/2024)    PRAPARE - Administrator, Civil Service (Medical): No    Lack of Transportation (Non-Medical): No  Physical Activity: Not on file  Stress: Not on file  Social Connections: Unknown (11/01/2021)   Received from [facility]   Social Network    Social Network: Not on file    Family History  Adopted: Yes    Health Maintenance  Topic Date Due   Hepatitis B Vaccines 19-59 Average Risk (1 of 3 - 19+ 3-dose series) Never done   HPV VACCINES (1 - Risk 3-dose SCDM series) Never done   COVID-19 Vaccine (3 - Pfizer risk series) 10/16/2019   INFLUENZA VACCINE  01/20/2024   Hepatitis C Screening  02/14/2024 (Originally 06/21/2004)   HIV Screening  02/14/2024 (Originally 06/21/2001)   DTaP/Tdap/Td (2 - Td or Tdap) 03/26/2025   Pneumococcal Vaccine  Aged Out   Meningococcal B Vaccine  Aged Out     ----------------------------------------------------------------------------------------------------------------------------------------------------------------------------------------------------------------- Physical Exam BP 93/65 (BP Location: Left Arm, Patient Position: Sitting, Cuff Size: Normal)   Pulse 78   Ht 5' 7.97 (1.726 m)   Wt 200 lb (90.7 kg)   SpO2 99%   BMI 30.44 kg/m   Physical Exam Constitutional:  Appearance: Normal appearance.  HENT:     Head: Normocephalic and atraumatic.  Cardiovascular:     Rate and Rhythm: Normal rate and regular rhythm.  Pulmonary:     Effort: Pulmonary effort is normal.     Breath sounds: Normal breath sounds.  Musculoskeletal:     Cervical back: Neck supple.  Neurological:     General: No focal deficit present.     Mental Status: He is alert.  Psychiatric:        Mood and Affect: Mood normal.        Behavior: Behavior normal.      ------------------------------------------------------------------------------------------------------------------------------------------------------------------------------------------------------------------- Assessment and Plan  Obesity (BMI 30-39.9) Overall doing well with Zepbound .  Will continue at 10mg  strength.  Chronic constipation Has had some increased constipation.  Will try adding fiber supplement and/or miralax.  Increase in fluids.   Low energy Orders Placed This Encounter  Procedures   CMP14+EGFR   CBC with Differential/Platelet   Lipid Panel With LDL/HDL Ratio   TSH   Testosterone    B12     No orders of the defined types were placed in this encounter.   Return in about 3 months (around 05/10/2024) for weight management.

## 2024-02-08 NOTE — Assessment & Plan Note (Signed)
 Overall doing well with Zepbound .  Will continue at 10mg  strength.

## 2024-02-08 NOTE — Assessment & Plan Note (Signed)
 Orders Placed This Encounter  Procedures   CMP14+EGFR   CBC with Differential/Platelet   Lipid Panel With LDL/HDL Ratio   TSH   Testosterone    B12

## 2024-02-08 NOTE — Assessment & Plan Note (Signed)
 Has had some increased constipation.  Will try adding fiber supplement and/or miralax.  Increase in fluids.

## 2024-02-09 LAB — CBC WITH DIFFERENTIAL/PLATELET
Basophils Absolute: 0 x10E3/uL (ref 0.0–0.2)
Basos: 0 %
EOS (ABSOLUTE): 0.3 x10E3/uL (ref 0.0–0.4)
Eos: 5 %
Hematocrit: 46.4 % (ref 37.5–51.0)
Hemoglobin: 15.8 g/dL (ref 13.0–17.7)
Immature Grans (Abs): 0.1 x10E3/uL (ref 0.0–0.1)
Immature Granulocytes: 1 %
Lymphocytes Absolute: 2.2 x10E3/uL (ref 0.7–3.1)
Lymphs: 30 %
MCH: 30.9 pg (ref 26.6–33.0)
MCHC: 34.1 g/dL (ref 31.5–35.7)
MCV: 91 fL (ref 79–97)
Monocytes Absolute: 0.7 x10E3/uL (ref 0.1–0.9)
Monocytes: 9 %
Neutrophils Absolute: 4 x10E3/uL (ref 1.4–7.0)
Neutrophils: 55 %
Platelets: 304 x10E3/uL (ref 150–450)
RBC: 5.12 x10E6/uL (ref 4.14–5.80)
RDW: 12.2 % (ref 11.6–15.4)
WBC: 7.3 x10E3/uL (ref 3.4–10.8)

## 2024-02-09 LAB — CMP14+EGFR
ALT: 72 IU/L — ABNORMAL HIGH (ref 0–44)
AST: 41 IU/L — ABNORMAL HIGH (ref 0–40)
Albumin: 4.3 g/dL (ref 4.1–5.1)
Alkaline Phosphatase: 80 IU/L (ref 44–121)
BUN/Creatinine Ratio: 16 (ref 9–20)
BUN: 15 mg/dL (ref 6–20)
Bilirubin Total: 0.6 mg/dL (ref 0.0–1.2)
CO2: 21 mmol/L (ref 20–29)
Calcium: 9.1 mg/dL (ref 8.7–10.2)
Chloride: 103 mmol/L (ref 96–106)
Creatinine, Ser: 0.91 mg/dL (ref 0.76–1.27)
Globulin, Total: 2.8 g/dL (ref 1.5–4.5)
Glucose: 86 mg/dL (ref 70–99)
Potassium: 4.3 mmol/L (ref 3.5–5.2)
Sodium: 138 mmol/L (ref 134–144)
Total Protein: 7.1 g/dL (ref 6.0–8.5)
eGFR: 111 mL/min/1.73 (ref >59)

## 2024-02-09 LAB — LIPID PANEL WITH LDL/HDL RATIO
Cholesterol, Total: 156 mg/dL (ref 100–199)
HDL: 33 mg/dL — ABNORMAL LOW (ref >39)
LDL Chol Calc (NIH): 107 mg/dL — ABNORMAL HIGH (ref 0–99)
LDL/HDL Ratio: 3.2 ratio (ref 0.0–3.6)
Triglycerides: 82 mg/dL (ref 0–149)
VLDL Cholesterol Cal: 16 mg/dL (ref 5–40)

## 2024-02-09 LAB — VITAMIN B12: Vitamin B-12: 458 pg/mL (ref 232–1245)

## 2024-02-09 LAB — TESTOSTERONE: Testosterone: 332 ng/dL (ref 264–916)

## 2024-02-09 LAB — TSH: TSH: 1.37 u[IU]/mL (ref 0.450–4.500)

## 2024-02-17 ENCOUNTER — Encounter: Payer: Self-pay | Admitting: Family Medicine

## 2024-04-27 ENCOUNTER — Other Ambulatory Visit: Payer: Self-pay | Admitting: Family Medicine

## 2024-04-27 NOTE — Telephone Encounter (Signed)
 Hold for 05/09/24 appt

## 2024-05-09 ENCOUNTER — Ambulatory Visit: Admitting: Family Medicine

## 2024-05-14 ENCOUNTER — Ambulatory Visit: Admitting: Family Medicine

## 2024-05-21 ENCOUNTER — Telehealth: Payer: Self-pay

## 2024-05-21 ENCOUNTER — Ambulatory Visit: Admitting: Family Medicine

## 2024-05-21 NOTE — Telephone Encounter (Signed)
 Copied from CRM #[id number]. Topic: Appointments - Appointment Cancel/Reschedule >> May 21, 2024 10:28 AM Charlet HERO wrote: Patient/patient representative is calling to reschedule an appointment. Refer to attachments for appointment information.

## 2024-05-21 NOTE — Telephone Encounter (Signed)
Patient has already been rescheduled.

## 2024-05-22 ENCOUNTER — Ambulatory Visit: Admitting: Family Medicine

## 2024-05-22 ENCOUNTER — Encounter: Payer: Self-pay | Admitting: Family Medicine

## 2024-05-22 VITALS — BP 104/73 | HR 80 | Ht 67.97 in | Wt 198.0 lb

## 2024-05-22 DIAGNOSIS — E785 Hyperlipidemia, unspecified: Secondary | ICD-10-CM | POA: Diagnosis not present

## 2024-05-22 DIAGNOSIS — E669 Obesity, unspecified: Secondary | ICD-10-CM

## 2024-05-22 DIAGNOSIS — R7989 Other specified abnormal findings of blood chemistry: Secondary | ICD-10-CM | POA: Diagnosis not present

## 2024-05-22 DIAGNOSIS — R5383 Other fatigue: Secondary | ICD-10-CM

## 2024-05-22 MED ORDER — ZEPBOUND 12.5 MG/0.5ML ~~LOC~~ SOAJ: 12.5000 mg | SUBCUTANEOUS | 3 refills | Status: AC

## 2024-05-22 NOTE — Progress Notes (Signed)
 Mario Pierce - 38 y.o. male MRN [id number]  Date of birth: 05-May-1986  Subjective Chief Complaint  Patient presents with   Weight Check    HPI Mario Pierce is a 37 y.o. male here to follow up regarding weight management.  He has been on Zepbound , currently at 10mg /week.  He reports that he is doing well with this.  Weight is down an additional 2lbs since last visit.  He feels that he is doing well with dietary changes.  Meeting with nutritionist through his employer. He is trying to stay more active.   ROS:  A comprehensive ROS was completed and negative except as noted per HPI  Allergies  Allergen Reactions   Mushroom Extract Complex (Obsolete) Rash    History reviewed. No pertinent past medical history.  Past Surgical History:  Procedure Laterality Date   SPINAL FUSION W/ LUQUE UNIT ROD     SPINE SURGERY      Social History   Socioeconomic History   Marital status: Married    Spouse name: Not on file   Number of children: Not on file   Years of education: Not on file   Highest education level: Not on file  Occupational History   Occupation: Emergency Planning/management Officer  Tobacco Use   Smoking status: Never   Smokeless tobacco: Never  Vaping Use   Vaping status: Never Used  Substance and Sexual Activity   Alcohol use: Yes    Alcohol/week: 1.0 - 2.0 standard drink of alcohol    Types: 1 - 2 Standard drinks or equivalent per week    Comment: Rarely   Drug use: No   Sexual activity: Yes    Partners: Female  Other Topics Concern   Not on file  Social History Narrative   Not on file   Social Drivers of Health   Financial Resource Strain: Low Risk  (02/08/2024)   Overall Financial Resource Strain (CARDIA)    Difficulty of Paying Living Expenses: Not hard at all  Food Insecurity: No Food Insecurity (02/08/2024)   Hunger Vital Sign    Worried About Running Out of Food in the Last Year: Never true    Ran Out of Food in the Last Year: Never true  Transportation Needs: No  Transportation Needs (02/08/2024)   PRAPARE - Administrator, Civil Service (Medical): No    Lack of Transportation (Non-Medical): No  Physical Activity: Inactive (02/08/2024)   Exercise Vital Sign    Days of Exercise per Week: 0 days    Minutes of Exercise per Session: 0 min  Stress: No Stress Concern Present (02/08/2024)   [employer] - Occupational Stress Questionnaire    Feeling of Stress: Only a little  Social Connections: Unknown (11/01/2021)   Received from [facility]   Social Network    Social Network: Not on file    Family History  Adopted: Yes    Health Maintenance  Topic Date Due   HIV Screening  Never done   Hepatitis C Screening  Never done   Influenza Vaccine  09/18/2024 (Originally 01/20/2024)   Hepatitis B Vaccines 19-59 Average Risk (1 of 3 - 19+ 3-dose series) 05/22/2025 (Originally 06/21/2005)   HPV VACCINES (1 - Risk 3-dose SCDM series) 05/22/2025 (Originally 06/21/2013)   COVID-19 Vaccine (3 - Pfizer risk series) 06/07/2025 (Originally 10/16/2019)   DTaP/Tdap/Td (2 - Td or Tdap) 03/26/2025   Pneumococcal Vaccine  Aged Out   Meningococcal B Vaccine  Aged Out     -----------------------------------------------------------------------------------------------------------------------------------------------------------------------------------------------------------------  Physical Exam BP 101/70 (BP Location: Right Arm, Patient Position: Sitting, Cuff Size: Normal)   Pulse 80   Ht 5' 7.97 (1.726 m)   Wt 198 lb (89.8 kg)   SpO2 99%   BMI 30.13 kg/m   Physical Exam Cardiovascular:     Rate and Rhythm: Normal rate and regular rhythm.  Pulmonary:     Effort: Pulmonary effort is normal.     Breath sounds: Normal breath sounds.  Neurological:     General: No focal deficit present.     Mental Status: He is alert.  Psychiatric:        Mood and Affect: Mood normal.        Behavior: Behavior normal.      ------------------------------------------------------------------------------------------------------------------------------------------------------------------------------------------------------------------- Assessment and Plan  Low energy Energy levels have improved with weight loss.   Obesity (BMI 30-39.9) Overall doing well with Zepbound .  Will increase to 12.5mg  strength.  Continue with dietary changes and increase in activity.    Meds ordered this encounter  Medications   tirzepatide  (ZEPBOUND ) 12.5 MG/0.5ML Pen    Sig: Inject 12.5 mg into the skin once a week.    Dispense:  2 mL    Refill:  3    Return in about 4 months (around 09/20/2024) for weight management.

## 2024-05-22 NOTE — Assessment & Plan Note (Addendum)
 Overall doing well with Zepbound .  Will increase to 12.5mg  strength.  Continue with dietary changes and increase in activity.

## 2024-05-22 NOTE — Assessment & Plan Note (Signed)
 Energy levels have improved with weight loss.

## 2024-05-23 ENCOUNTER — Other Ambulatory Visit (HOSPITAL_COMMUNITY): Payer: Self-pay

## 2024-05-23 LAB — LIPID PANEL WITH LDL/HDL RATIO
Cholesterol, Total: 163 mg/dL (ref 100–199)
HDL: 46 mg/dL (ref >39)
LDL Chol Calc (NIH): 93 mg/dL (ref 0–99)
LDL/HDL Ratio: 2 ratio (ref 0.0–3.6)
Triglycerides: 136 mg/dL (ref 0–149)
VLDL Cholesterol Cal: 24 mg/dL (ref 5–40)

## 2024-05-23 LAB — TESTOSTERONE,FREE AND TOTAL
Testosterone, Free: 14.1 pg/mL (ref 8.7–25.1)
Testosterone: 405 ng/dL (ref 264–916)

## 2024-05-23 LAB — HEPATIC FUNCTION PANEL
ALT: 63 IU/L — ABNORMAL HIGH (ref 0–44)
AST: 39 IU/L (ref 0–40)
Albumin: 4.6 g/dL (ref 4.1–5.1)
Alkaline Phosphatase: 89 IU/L (ref 47–123)
Bilirubin Total: 0.7 mg/dL (ref 0.0–1.2)
Bilirubin, Direct: 0.19 mg/dL (ref 0.00–0.40)
Total Protein: 7.3 g/dL (ref 6.0–8.5)

## 2024-05-25 ENCOUNTER — Ambulatory Visit: Payer: Self-pay | Admitting: Family Medicine

## 2024-06-28 ENCOUNTER — Other Ambulatory Visit: Payer: Self-pay | Admitting: Family Medicine

## 2024-06-28 DIAGNOSIS — F411 Generalized anxiety disorder: Secondary | ICD-10-CM

## 2024-07-09 ENCOUNTER — Other Ambulatory Visit (HOSPITAL_COMMUNITY): Payer: Self-pay

## 2024-07-09 ENCOUNTER — Telehealth: Payer: Self-pay

## 2024-07-09 NOTE — Telephone Encounter (Signed)
 Pharmacy Patient Advocate Encounter   Received notification from [employer] KEY that prior authorization for Zepbound  12.5MG /0.5ML pen-injectors  is required/requested.   Insurance verification completed.   The patient is insured through [employer].   Per test claim: PA required and submitted KEY/EOC/Request #: [id number] due to:  Member required to enroll in virtual care program before PA can be submitted   Per test claim: Member must engage with [facility]

## 2024-09-20 ENCOUNTER — Ambulatory Visit: Admitting: Family Medicine
